# Patient Record
Sex: Female | Born: 2000 | Race: White | Hispanic: No | Marital: Single
Health system: Southern US, Community
[De-identification: ages and names within clinical notes are randomized; demographics above are authoritative.]

---
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
## 2023-09-21 ENCOUNTER — Other Ambulatory Visit: Payer: Self-pay | Admitting: Physician Assistant

## 2023-09-21 DIAGNOSIS — N6310 Unspecified lump in the right breast, unspecified quadrant: Secondary | ICD-10-CM

## 2024-05-02 ENCOUNTER — Emergency Department (HOSPITAL_COMMUNITY)
Admission: EM | Admit: 2024-05-02 | Discharge: 2024-05-02 | Disposition: A | Discharge status: Home / Self Care | Attending: Emergency Medicine | Admitting: Emergency Medicine

## 2024-05-02 ENCOUNTER — Encounter (HOSPITAL_COMMUNITY): Payer: Self-pay

## 2024-05-02 DIAGNOSIS — R0789 Other chest pain: Secondary | ICD-10-CM | POA: Insufficient documentation

## 2024-05-02 DIAGNOSIS — R1013 Epigastric pain: Secondary | ICD-10-CM | POA: Diagnosis present

## 2024-05-02 LAB — CBC WITH DIFFERENTIAL/PLATELET
Abs Immature Granulocytes: 0.03 K/uL (ref 0.00–0.07)
Basophils Absolute: 0.1 K/uL (ref 0.0–0.1)
Basophils Relative: 1 %
Eosinophils Absolute: 0.1 K/uL (ref 0.0–0.5)
Eosinophils Relative: 1 %
HCT: 42.8 % (ref 36.0–46.0)
Hemoglobin: 14.1 g/dL (ref 12.0–15.0)
Immature Granulocytes: 0 %
Lymphocytes Relative: 20 %
Lymphs Abs: 1.7 K/uL (ref 0.7–4.0)
MCH: 30.9 pg (ref 26.0–34.0)
MCHC: 32.9 g/dL (ref 30.0–36.0)
MCV: 93.9 fL (ref 80.0–100.0)
Monocytes Absolute: 0.9 K/uL (ref 0.1–1.0)
Monocytes Relative: 11 %
Neutro Abs: 5.7 K/uL (ref 1.7–7.7)
Neutrophils Relative %: 67 %
Platelets: 194 K/uL (ref 150–400)
RBC: 4.56 MIL/uL (ref 3.87–5.11)
RDW: 11.6 % (ref 11.5–15.5)
WBC: 8.5 K/uL (ref 4.0–10.5)
nRBC: 0 % (ref 0.0–0.2)

## 2024-05-02 LAB — URINALYSIS, ROUTINE W REFLEX MICROSCOPIC
Bilirubin Urine: NEGATIVE
Glucose, UA: NEGATIVE mg/dL
Hgb urine dipstick: NEGATIVE
Ketones, ur: NEGATIVE mg/dL
Leukocytes,Ua: NEGATIVE
Nitrite: NEGATIVE
Protein, ur: NEGATIVE mg/dL
Specific Gravity, Urine: 1.018 (ref 1.005–1.030)
pH: 6 (ref 5.0–8.0)

## 2024-05-02 LAB — COMPREHENSIVE METABOLIC PANEL WITH GFR
ALT: 11 U/L (ref 0–44)
AST: 14 U/L — ABNORMAL LOW (ref 15–41)
Albumin: 4 g/dL (ref 3.5–5.0)
Alkaline Phosphatase: 44 U/L (ref 38–126)
Anion gap: 13 (ref 5–15)
BUN: 12 mg/dL (ref 6–20)
CO2: 24 mmol/L (ref 22–32)
Calcium: 9.1 mg/dL (ref 8.9–10.3)
Chloride: 103 mmol/L (ref 98–111)
Creatinine, Ser: 0.61 mg/dL (ref 0.44–1.00)
GFR, Estimated: >60 mL/min (ref >60)
Glucose, Bld: 98 mg/dL (ref 70–99)
Potassium: 3.9 mmol/L (ref 3.5–5.1)
Sodium: 140 mmol/L (ref 135–145)
Total Bilirubin: 0.6 mg/dL (ref 0.0–1.2)
Total Protein: 7 g/dL (ref 6.5–8.1)

## 2024-05-02 LAB — PREGNANCY, URINE: Preg Test, Ur: NEGATIVE

## 2024-05-02 LAB — D-DIMER, QUANTITATIVE: D-Dimer, Quant: <0.27 ug{FEU}/mL (ref 0.00–0.50)

## 2024-05-02 LAB — LIPASE, BLOOD: Lipase: 31 U/L (ref 11–51)

## 2024-05-02 MED ORDER — NAPROXEN 500 MG PO TABS: 500.0000 mg | ORAL_TABLET | Freq: Two times a day (BID) | ORAL | 0 refills | Status: AC

## 2024-05-02 MED ORDER — METHOCARBAMOL 500 MG PO TABS: 500.0000 mg | ORAL_TABLET | Freq: Two times a day (BID) | ORAL | 0 refills | Status: AC

## 2024-05-02 NOTE — ED Provider Triage Note (Signed)
 Emergency Medicine Provider Triage Evaluation Note  Shirrell Solinger , a 23 y.o. female  was evaluated in triage.  Pt complains of abd pain. Pain to R side of abdomen x 3-4 days.  Hurts with movement but also with breathing with some SOB.  Had some R shoulder arn R arm pain.  On birth control.  No prior hx of PE/DVT  Review of Systems  Positive: As above Negative: As above  Physical Exam  BP (!) 141/65 (BP Location: Right Arm)   Pulse 90   Temp 98 F (36.7 C)   Resp 14   SpO2 99%  Gen:   Awake, no distress   Resp:  Normal effort  MSK:   Moves extremities without difficulty  Other:    Medical Decision Making  Medically screening exam initiated at 2:17 PM.  Appropriate orders placed.  Chesley Westrich was informed that the remainder of the evaluation will be completed by another provider, this initial triage assessment does not replace that evaluation, and the importance of remaining in the ED until their evaluation is complete.     Nivia Colon, PA-C 05/02/24 1420

## 2024-05-02 NOTE — ED Triage Notes (Signed)
 Pt c/o pain in right shoulder that radiates down he rarm, started yesterday, had to leave work due to pain. Today started having RUQ pain as well that comes and goes.

## 2024-05-02 NOTE — ED Provider Notes (Signed)
 [place] [facility] Provider Note   CSN: [id number] Arrival date & time: 05/02/24  1212     Patient presents with: No chief complaint on file.   Caitlin Waller is a 23 y.o. female.  HPI Patient is a 23 year old female presenting today for concerns for epigastric and right sided lower rib pain that she noticed today.  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   Was coming back from work where she noticed the acute pain particularly when she was taking a deep breath.  Noted that she had also had small cough previously.  LMP 2 weeks ago  No chronic past medical history.  Endorses marijuana use.  Denies chronic NSAID use.  Denies fever, headache, vision changes, shortness of breath, congestion, rhinorrhea, dysphagia, odynophagia, nausea, vomiting, diarrhea, dysuria, hematuria, vaginal discharge, vaginal pain, vaginal bleeding, rashes, lower leg swelling, numbness, weakness, tingling.    Prior to Admission medications   Medication Sig Start Date End Date Taking? Authorizing Provider  methocarbamol (ROBAXIN) 500 MG tablet Take 1 tablet (500 mg total) by mouth 2 (two) times daily. 05/02/24  Yes Beola Terrall RAMAN, PA-C  naproxen (NAPROSYN) 500 MG tablet Take 1 tablet (500 mg total) by mouth 2 (two) times daily. 05/02/24  Yes Beola Terrall RAMAN, PA-C  ibuprofen  (IBUPROFEN ) 100 MG/5ML suspension Take 20 mLs (400 mg total) by mouth every 6 (six) hours as needed. 02/18/17   Olympia Gee, PA-C    Allergies: Patient has no known allergies.    Review of Systems  Cardiovascular:  Positive for chest pain.  Gastrointestinal:  Positive for abdominal pain.  All other systems reviewed and are negative.   Updated Vital Signs BP (!) 141/65 (BP Location: Right Arm)   Pulse 90   Temp 98 F (36.7 C)   Resp 14    SpO2 99%   Physical Exam Vitals and nursing note reviewed.  Constitutional:      General: She is not in acute distress.    Appearance: Normal appearance. She is not ill-appearing or diaphoretic.  HENT:     Head: Normocephalic and atraumatic.  Eyes:     General: No scleral icterus.       Right eye: No discharge.        Left eye: No discharge.     Extraocular Movements: Extraocular movements intact.     Conjunctiva/sclera: Conjunctivae normal.     Pupils: Pupils are equal, round, and reactive to light.  Cardiovascular:     Rate and Rhythm: Normal rate and regular rhythm.     Pulses: Normal pulses.     Heart sounds: Normal heart sounds. No murmur heard.    No friction rub. No gallop.  Pulmonary:     Effort: Pulmonary effort is normal. No respiratory distress.     Breath sounds: No stridor. No wheezing, rhonchi or rales.  Chest:     Chest wall: Tenderness (Notably has right lower chest wall tenderness to palpation.) present.  Abdominal:     General: Abdomen is flat. There is no distension.     Palpations: Abdomen is soft.     Tenderness: There is no abdominal tenderness. There is no right CVA tenderness, left CVA tenderness, guarding or rebound. Negative signs include Murphy's sign, Rovsing's sign and McBurney's sign.  Musculoskeletal:  General: No swelling, deformity or signs of injury.     Cervical back: Normal range of motion. No rigidity.     Right lower leg: No edema.     Left lower leg: No edema.  Skin:    General: Skin is warm and dry.     Findings: No bruising, erythema or lesion.  Neurological:     General: No focal deficit present.     Mental Status: She is alert and oriented to person, place, and time. Mental status is at baseline.     Sensory: No sensory deficit.     Motor: No weakness.  Psychiatric:        Mood and Affect: Mood normal.     (all labs ordered are listed, but only abnormal results are displayed) Labs Reviewed  COMPREHENSIVE METABOLIC  PANEL WITH GFR - Abnormal; Notable for the following components:      Result Value   AST 14 (*)    All other components within normal limits  URINALYSIS, ROUTINE W REFLEX MICROSCOPIC - Abnormal; Notable for the following components:   APPearance HAZY (*)    All other components within normal limits  CBC WITH DIFFERENTIAL/PLATELET  LIPASE, BLOOD  PREGNANCY, URINE  D-DIMER, QUANTITATIVE    EKG: None  Radiology: No results found.  Procedures   Medications Ordered in the ED - No data to display  Medical Decision Making This patient is a 23 year old female who presents to the ED for concern of epigastric pain and right lower rib pain that she noticed this morning on her way back from work after working at [employer], noting that she did have extensive testing done both yesterday and today which she thinks she may have possibly pulled something.  Also noted that she did have marijuana and alcohol over the course last couple days.  On physical exam, patient is in no acute distress, afebrile, alert and orient x 4, speaking in full sentences, nontachypneic, nontachycardic.  LCTAB, RRR, no murmur, no abdominal tenderness palpation.  Negative Murphy sign.  Mild right lower rib pain to palpation along chest wall.  Exam is otherwise unremarkable, no lower leg edema.  Patient lab work is unremarkable with liver enzymes, lipase, urine, dimer unremarkable.  Low suspicion for cholecystitis, PE, ACS, PUD.  Suspecting likely musculoskeletal strain with recent stretching and cough.  Low suspicion for pneumonia.  Has show decision making with patient who did not wish to undergo any further testing at this time.  Will send home with anti-inflammatories and muscle laxer.  Patient vital signs have remained stable throughout the course of patient's time in the ED. Low suspicion for any other emergent pathology at this time. I believe this patient is safe to be discharged. Provided strict return to ER  precautions. Patient expressed agreement and understanding of plan. All questions were answered.  Differential diagnoses prior to evaluation: The emergent differential diagnosis includes, but is not limited to,  PUD, gastritis, pancreatitis, gastroparesis, malignancy, biliary disease, ACS, pericarditis, pneumonia, intestinal ischemia, esophageal rupture, hepatitis, pregnancy, ACS, pericarditis, myocarditis, aortic dissection, PE, pneumothorax, esophageal rupture, pneumonia, reflux/PUD, biliary disease, pancreatitis, costochondritis, anxiety  . This is not an exhaustive differential.   Past Medical History / Co-morbidities / Social History: No current past medical history  Additional history: Chart reviewed. Pertinent results include: Last saw PCP on 3//25.  Lab Tests/Imaging studies: I personally interpreted labs/imaging and the pertinent results include:    CBC unremarkable CMP unremarkable UA unremarkable Urine pregnancy is negative Lipase unremarkable  D-dimer unremarkable.    Medications: I ordered medication including naproxen, Robaxin.  I have reviewed the patients home medicines and have made adjustments as needed.  Critical Interventions: None  Social Determinants of Health: Has good follow-up with PCP  Disposition: After consideration of the diagnostic results and the patients response to treatment, I feel that the patient would benefit from discharge treatment as above.   emergency department workup does not suggest an emergent condition requiring admission or immediate intervention beyond what has been performed at this time. The plan is: Follow-up with PCP, return to the ER for any new or worsening symptoms, muscle relaxer and symptomatic management at home. The patient is safe for discharge and has been instructed to return immediately for worsening symptoms, change in symptoms or any other concerns.  Final diagnoses:  Epigastric abdominal pain  Chest wall pain     ED Discharge Orders          Ordered    methocarbamol (ROBAXIN) 500 MG tablet  2 times daily        05/02/24 1641    naproxen (NAPROSYN) 500 MG tablet  2 times daily        05/02/24 1641               Beola Terrall RAMAN, [place] 05/02/24 1642    Yolande Lamar BROCKS, MD 05/03/24 1359

## 2024-05-02 NOTE — Discharge Instructions (Signed)
 You are seen today for chest wall pain along right lower ribs as well as some mild epigastric pain.  Your physical exam and lab work were very reassuring this time I do low suspicion for any emergent cause your symptoms today.  We recommend you continue to follow-up with your PCP for reevaluation if the pain persist.  And return to ED for new or worsening symptoms or include blood in urine or stool, persistent vomiting, shortness of breath, uncontrollable chest pain, fainting.  Otherwise worsening anti-inflammatory and muscle laxer for likely the muscle strain you have experienced on the right lower side of your ribs and side.  Please take Naprosyn, 500mg  by mouth twice daily as needed for pain - this in an antiinflammatory medicine (NSAID) and is similar to ibuprofen  - many people feel that it is stronger than ibuprofen  and it is easier to take since it is a smaller pill.  Please use this only for 1 week - if your pain persists, you will need to follow up with your doctor in the office for ongoing guidance and pain control.   Please take Robaxin, 500 mg up to twice a day as needed for muscle spasm, this is a muscle relaxer, it may cause generalized weakness, sleepiness and you should not drive or do important things while taking this medication.

## 2024-05-06 ENCOUNTER — Other Ambulatory Visit: Payer: Self-pay | Admitting: Physician Assistant

## 2024-05-06 DIAGNOSIS — R1011 Right upper quadrant pain: Secondary | ICD-10-CM

## 2024-05-23 ENCOUNTER — Other Ambulatory Visit
